# Patient Record
Sex: Female | Born: 2000 | Race: Black or African American | Hispanic: No | Marital: Single | Smoking: Never smoker
Health system: Southern US, Community
[De-identification: ages and names within clinical notes are randomized; demographics above are authoritative.]

## PROBLEM LIST (undated history)

## (undated) DIAGNOSIS — J45909 Unspecified asthma, uncomplicated: Secondary | ICD-10-CM

## (undated) HISTORY — DX: Unspecified asthma, uncomplicated: J45.909

## (undated) HISTORY — PX: NO PAST SURGERIES: SHX2092

---
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
## 2019-11-14 ENCOUNTER — Emergency Department (HOSPITAL_COMMUNITY)
Admission: EM | Admit: 2019-11-14 | Discharge: 2019-11-14 | Disposition: A | Discharge status: Home / Self Care | Payer: Medicaid Other

## 2020-11-15 ENCOUNTER — Emergency Department (HOSPITAL_COMMUNITY)
Admission: EM | Admit: 2020-11-15 | Discharge: 2020-11-16 | Disposition: A | Discharge status: Home / Self Care | Payer: Medicaid Other | Attending: Emergency Medicine | Admitting: Emergency Medicine

## 2020-11-15 ENCOUNTER — Emergency Department (HOSPITAL_COMMUNITY): Payer: Medicaid Other

## 2020-11-15 ENCOUNTER — Encounter (HOSPITAL_COMMUNITY): Payer: Self-pay

## 2020-11-15 DIAGNOSIS — M549 Dorsalgia, unspecified: Secondary | ICD-10-CM | POA: Insufficient documentation

## 2020-11-15 DIAGNOSIS — R109 Unspecified abdominal pain: Secondary | ICD-10-CM | POA: Insufficient documentation

## 2020-11-15 DIAGNOSIS — Z5321 Procedure and treatment not carried out due to patient leaving prior to being seen by health care provider: Secondary | ICD-10-CM | POA: Diagnosis not present

## 2020-11-15 DIAGNOSIS — M545 Low back pain, unspecified: Secondary | ICD-10-CM

## 2020-11-15 LAB — COMPREHENSIVE METABOLIC PANEL
ALT: 12 U/L (ref 0–44)
AST: 12 U/L — ABNORMAL LOW (ref 15–41)
Albumin: 4.3 g/dL (ref 3.5–5.0)
Alkaline Phosphatase: 64 U/L (ref 38–126)
Anion gap: 5 (ref 5–15)
BUN: 6 mg/dL (ref 6–20)
CO2: 24 mmol/L (ref 22–32)
Calcium: 9.3 mg/dL (ref 8.9–10.3)
Chloride: 108 mmol/L (ref 98–111)
Creatinine, Ser: 0.64 mg/dL (ref 0.44–1.00)
GFR, Estimated: >60 mL/min (ref >60)
Glucose, Bld: 91 mg/dL (ref 70–99)
Potassium: 4.2 mmol/L (ref 3.5–5.1)
Sodium: 137 mmol/L (ref 135–145)
Total Bilirubin: 1.4 mg/dL — ABNORMAL HIGH (ref 0.3–1.2)
Total Protein: 8 g/dL (ref 6.5–8.1)

## 2020-11-15 LAB — CBC WITH DIFFERENTIAL/PLATELET
Abs Immature Granulocytes: 0.01 10*3/uL (ref 0.00–0.07)
Basophils Absolute: 0 10*3/uL (ref 0.0–0.1)
Basophils Relative: 1 %
Eosinophils Absolute: 0 10*3/uL (ref 0.0–0.5)
Eosinophils Relative: 1 %
HCT: 42.4 % (ref 36.0–46.0)
Hemoglobin: 13.8 g/dL (ref 12.0–15.0)
Immature Granulocytes: 0 %
Lymphocytes Relative: 36 %
Lymphs Abs: 1.6 10*3/uL (ref 0.7–4.0)
MCH: 27.2 pg (ref 26.0–34.0)
MCHC: 32.5 g/dL (ref 30.0–36.0)
MCV: 83.6 fL (ref 80.0–100.0)
Monocytes Absolute: 0.3 10*3/uL (ref 0.1–1.0)
Monocytes Relative: 6 %
Neutro Abs: 2.4 10*3/uL (ref 1.7–7.7)
Neutrophils Relative %: 56 %
Platelets: 259 10*3/uL (ref 150–400)
RBC: 5.07 MIL/uL (ref 3.87–5.11)
RDW: 14.3 % (ref 11.5–15.5)
WBC: 4.3 10*3/uL (ref 4.0–10.5)
nRBC: 0 % (ref 0.0–0.2)

## 2020-11-15 LAB — HCG, SERUM, QUALITATIVE: Preg, Serum: NEGATIVE

## 2020-11-15 NOTE — ED Triage Notes (Signed)
Pt arrived via POV, c/o back pain x3 days. Had this issue x2 weeks, was not seen, spontaneous relief. Denies any trauma. Ambulatory to triage.

## 2020-11-15 NOTE — ED Provider Notes (Signed)
Emergency Medicine Provider Triage Evaluation Note  Theresa Barrett , a 20 y.o. female  was evaluated in triage.  Pt complains of back pain.  Similar situation several days ago with associated diarrhea.  Diarrhea and back pain resolved. But now back pain returned.  Associated nausea, no vomiting.  No urinary symptoms.  No trauma or injury.  Review of Systems  Positive: Back pain Negative: fever  Physical Exam  BP 122/72 (BP Location: Right Arm)   Pulse 77   Temp 98.8 F (37.1 C) (Oral)   Resp 18   Ht 5\' 4"  (1.626 m)   SpO2 98%  Gen:   Awake, no distress   Resp:  Normal effort  MSK:   Moves extremities without difficulty  Other:  Ttp of bilateral flanks L>R  Medical Decision Making  Medically screening exam initiated at 6:28 PM.  Appropriate orders placed.  [name] was informed that the remainder of the evaluation will be completed by another provider, this initial triage assessment does not replace that evaluation, and the importance of remaining in the ED until their evaluation is complete.     Wilber Bihari, PA-C 11/15/20 1829    01/15/21, MD 11/15/20 1857

## 2021-01-25 ENCOUNTER — Emergency Department (HOSPITAL_COMMUNITY)
Admission: EM | Admit: 2021-01-25 | Discharge: 2021-01-25 | Disposition: A | Discharge status: Home / Self Care | Payer: Medicaid Other | Attending: Emergency Medicine | Admitting: Emergency Medicine

## 2021-01-25 ENCOUNTER — Encounter (HOSPITAL_COMMUNITY): Payer: Self-pay | Admitting: Emergency Medicine

## 2021-01-25 ENCOUNTER — Other Ambulatory Visit: Payer: Self-pay

## 2021-01-25 DIAGNOSIS — N9489 Other specified conditions associated with female genital organs and menstrual cycle: Secondary | ICD-10-CM | POA: Insufficient documentation

## 2021-01-25 DIAGNOSIS — Z3202 Encounter for pregnancy test, result negative: Secondary | ICD-10-CM | POA: Diagnosis not present

## 2021-01-25 DIAGNOSIS — R110 Nausea: Secondary | ICD-10-CM | POA: Diagnosis not present

## 2021-01-25 LAB — I-STAT BETA HCG BLOOD, ED (MC, WL, AP ONLY): I-stat hCG, quantitative: <5 m[IU]/mL (ref <5)

## 2021-01-25 NOTE — ED Triage Notes (Signed)
Patient presents after having 2 positive urine pregnancy tests x3 days ago and would like confirmation. Patient complains of being tired with some associated nausea.

## 2021-01-25 NOTE — ED Provider Notes (Signed)
[facility]   CSN: [id number] Arrival date & time: 01/25/21  0120     History Chief Complaint  Patient presents with   Requesting Pregnancy Test    Theresa Barrett is a 20 y.o. female.  Pt presents to the ED today requesting a pregnancy test.  She had a very light period in December.  Pt has some nausea.        No past medical history on file.  Patient Active Problem List   Diagnosis Date Noted   Obesity, unspecified 04/16/2013    History reviewed. No pertinent surgical history.   OB History   No obstetric history on file.     No family history on file.  Social History   Tobacco Use   Smoking status: Never   Smokeless tobacco: Never  Substance Use Topics   Alcohol use: Yes   Drug use: Yes    Home Medications Prior to Admission medications   Medication Sig Start Date End Date Taking? Authorizing Provider  albuterol (PROVENTIL HFA;VENTOLIN HFA) 108 (90 BASE) MCG/ACT inhaler Inhale 2 puffs into the lungs every 6 (six) hours as needed for wheezing or shortness of breath. Use before exercise as needed 04/16/13   Copland, Gwenlyn Found, MD    Allergies    Patient has no known allergies.  Review of Systems   Review of Systems  Gastrointestinal:  Positive for nausea.  All other systems reviewed and are negative.  Physical Exam Updated Vital Signs BP (!) 143/100 (BP Location: Right Arm)   Pulse 87   Temp 97.8 F (36.6 C) (Oral)   Resp 17   Ht 5\' 5"  (1.651 m)   Wt 78.5 kg   SpO2 100%   BMI 28.79 kg/m   Physical Exam Vitals and nursing note reviewed.  Constitutional:      Appearance: Normal appearance.  HENT:     Head: Normocephalic and atraumatic.     Right Ear: External ear normal.     Left Ear: External ear normal.     Nose: Nose normal.     Mouth/Throat:     Mouth: Mucous membranes are moist.     Pharynx: Oropharynx is clear.  Eyes:     Extraocular Movements: Extraocular movements intact.      Conjunctiva/sclera: Conjunctivae normal.     Pupils: Pupils are equal, round, and reactive to light.  Cardiovascular:     Rate and Rhythm: Normal rate and regular rhythm.     Pulses: Normal pulses.     Heart sounds: Normal heart sounds.  Pulmonary:     Effort: Pulmonary effort is normal.     Breath sounds: Normal breath sounds.  Abdominal:     General: Abdomen is flat. Bowel sounds are normal.     Palpations: Abdomen is soft.  Musculoskeletal:        General: Normal range of motion.     Cervical back: Normal range of motion and neck supple.  Skin:    General: Skin is warm.     Capillary Refill: Capillary refill takes less than 2 seconds.  Neurological:     General: No focal deficit present.     Mental Status: She is alert and oriented to person, place, and time.  Psychiatric:        Mood and Affect: Mood normal.        Behavior: Behavior normal.    ED Results / Procedures / Treatments   Labs (all labs ordered are listed, but  only abnormal results are displayed) Labs Reviewed  I-STAT BETA HCG BLOOD, ED ([facility], [facility], [facility] ONLY)    EKG None  Radiology No results found.  Procedures Procedures   Medications Ordered in ED Medications - No data to display  ED Course  I have reviewed the triage vital signs and the nursing notes.  Pertinent labs & imaging results that were available during my care of the patient were reviewed by me and considered in my medical decision making (see chart for details).    MDM Rules/Calculators/A&P                           Pregnancy test is negative.  She does not want any other evaluation.  She is stable for d/c.  She is [facility] f[facility]u with the [facility] clinic.  Return if worse.  Final Clinical Impression(s) / ED Diagnoses Final diagnoses:  Pregnancy examination or test, negative result    Rx / DC Orders ED Discharge Orders     None        Jacalyn Lefevre, MD 01/25/21 [id number]

## 2021-01-25 NOTE — Discharge Instructions (Signed)
If abd pain worsens, you need to come back.

## 2021-08-25 ENCOUNTER — Encounter: Payer: Medicaid Other | Admitting: Obstetrics

## 2021-09-10 ENCOUNTER — Encounter: Payer: Self-pay | Admitting: Obstetrics and Gynecology

## 2021-09-10 ENCOUNTER — Other Ambulatory Visit (HOSPITAL_COMMUNITY)
Admission: RE | Admit: 2021-09-10 | Discharge: 2021-09-10 | Disposition: A | Discharge status: Home / Self Care | Payer: Medicaid Other | Source: Ambulatory Visit | Attending: Obstetrics and Gynecology | Admitting: Obstetrics and Gynecology

## 2021-09-10 ENCOUNTER — Ambulatory Visit (INDEPENDENT_AMBULATORY_CARE_PROVIDER_SITE_OTHER): Payer: Medicaid Other | Admitting: Obstetrics and Gynecology

## 2021-09-10 DIAGNOSIS — N760 Acute vaginitis: Secondary | ICD-10-CM | POA: Diagnosis not present

## 2021-09-10 DIAGNOSIS — A5602 Chlamydial vulvovaginitis: Secondary | ICD-10-CM | POA: Diagnosis not present

## 2021-09-10 DIAGNOSIS — L732 Hidradenitis suppurativa: Secondary | ICD-10-CM

## 2021-09-10 DIAGNOSIS — Z202 Contact with and (suspected) exposure to infections with a predominantly sexual mode of transmission: Secondary | ICD-10-CM | POA: Diagnosis present

## 2021-09-10 DIAGNOSIS — Z01419 Encounter for gynecological examination (general) (routine) without abnormal findings: Secondary | ICD-10-CM | POA: Diagnosis present

## 2021-09-10 MED ORDER — DOXYCYCLINE HYCLATE 100 MG PO CAPS: 100.0000 mg | ORAL_CAPSULE | Freq: Two times a day (BID) | ORAL | 0 refills | Status: DC

## 2021-09-10 NOTE — Progress Notes (Signed)
Theresa Barrett is a 21 y.o. G0P0000 female here for a routine annual gynecologic exam.  Current complaints: "skin problem".   Denies abnormal vaginal bleeding, discharge, pelvic pain, problems with intercourse or other gynecologic concerns.    Gynecologic History Patient's last menstrual period was 08/21/2021. Contraception: condoms Last Pap: NA.  Last mammogram: NA.   Obstetric History OB History  Gravida Para Term Preterm AB Living  0 0 0 0 0 0  SAB IAB Ectopic Multiple Live Births  0 0 0 0 0    Past Medical History:  Diagnosis Date   Asthma     Past Surgical History:  Procedure Laterality Date   NO PAST SURGERIES      Current Outpatient Medications on File Prior to Visit  Medication Sig Dispense Refill   albuterol (PROVENTIL HFA;VENTOLIN HFA) 108 (90 BASE) MCG/ACT inhaler Inhale 2 puffs into the lungs every 6 (six) hours as needed for wheezing or shortness of breath. Use before exercise as needed (Patient not taking: Reported on 09/10/2021) 1 Inhaler 2   No current facility-administered medications on file prior to visit.    No Known Allergies  Social History   Socioeconomic History   Marital status: Single    Spouse name: Not on file   Number of children: 0   Years of education: Not on file   Highest education level: Not on file  Occupational History   Not on file  Tobacco Use   Smoking status: Never   Smokeless tobacco: Never  Vaping Use   Vaping Use: Never used  Substance and Sexual Activity   Alcohol use: Yes   Drug use: Yes    Types: Marijuana   Sexual activity: Yes    Birth control/protection: None  Other Topics Concern   Not on file  Social History Narrative   Not on file   Social Determinants of Health   Financial Resource Strain: Not on file  Food Insecurity: Not on file  Transportation Needs: Not on file  Physical Activity: Not on file  Stress: Not on file  Social Connections: Not on file  Intimate Partner Violence: Not on file     Family History  Problem Relation Age of Onset   Cancer Paternal Grandmother     The following portions of the patient's history were reviewed and updated as appropriate: allergies, current medications, past family history, past medical history, past social history, past surgical history and problem list.  Review of Systems Pertinent items are noted in HPI.   Objective:  BP 121/76   Pulse 71   Ht 5\' 5"  (1.651 m)   Wt 159 lb (72.1 kg)   LMP 08/21/2021   BMI 26.46 kg/m  CONSTITUTIONAL: Well-developed, well-nourished female in no acute distress.  HENT:  Normocephalic, atraumatic, External right and left ear normal. Oropharynx is clear and moist EYES: Conjunctivae and EOM are normal. Pupils are equal, round, and reactive to light. No scleral icterus.  NECK: Normal range of motion, supple, no masses.  Normal thyroid.  SKIN: Skin is warm and dry. No rash noted. Not diaphoretic. No erythema. No pallor. NEUROLGIC: Alert and oriented to person, place, and time. Normal reflexes, muscle tone coordination. No cranial nerve deficit noted. PSYCHIATRIC: Normal mood and affect. Normal behavior. Normal judgment and thought content. CARDIOVASCULAR: Normal heart rate noted, regular rhythm RESPIRATORY: Clear to auscultation bilaterally. Effort and breath sounds normal, no problems with respiration noted. BREASTS: Symmetric in size. No masses, skin changes, nipple drainage, or lymphadenopathy. Areas of hidradenitis noted  ABDOMEN: Soft, normal bowel sounds, no distention noted.  No tenderness, rebound or guarding.  PELVIC: Normal appearing external genitalia; normal appearing vaginal mucosa and cervix.  No abnormal discharge noted.  Pap smear obtained.  Normal uterine size, no other palpable masses, no uterine or adnexal tenderness. MUSCULOSKELETAL: Normal range of motion. No tenderness.  No cyanosis, clubbing, or edema.  2+ distal pulses.   Assessment:  Annual gynecologic examination with pap  smear STD test Hidradenitis Plan:  Will follow up results of pap smear and manage accordingly. Declines contraception. STD testing as per pt request Doxycyline for hidradenitis Routine preventative health maintenance measures emphasized. Please refer to After Visit Summary for other counseling recommendations.    Hermina Staggers, MD, FACOG Attending Obstetrician & Gynecologist Center for [facility], [facility]

## 2021-09-10 NOTE — Patient Instructions (Signed)

## 2021-09-10 NOTE — Progress Notes (Signed)
21 y.o New GYN presents for AEX/PAP/STD screening. Reports no problems today.

## 2021-09-11 LAB — CERVICOVAGINAL ANCILLARY ONLY
Bacterial Vaginitis (gardnerella): POSITIVE — AB
Candida Glabrata: NEGATIVE
Candida Vaginitis: NEGATIVE
Chlamydia: POSITIVE — AB
Comment: NEGATIVE
Comment: NEGATIVE
Comment: NEGATIVE
Comment: NEGATIVE
Comment: NEGATIVE
Comment: NORMAL
Neisseria Gonorrhea: NEGATIVE
Trichomonas: NEGATIVE

## 2021-09-11 LAB — HEPB+HEPC+HIV PANEL
HIV Screen 4th Generation wRfx: NONREACTIVE
Hep B C IgM: NEGATIVE
Hep B Core Total Ab: NEGATIVE
Hep B E Ab: NEGATIVE
Hep B E Ag: NEGATIVE
Hep B Surface Ab, Qual: NONREACTIVE
Hep C Virus Ab: NONREACTIVE
Hepatitis B Surface Ag: NEGATIVE

## 2021-09-11 LAB — RPR: RPR Ser Ql: NONREACTIVE

## 2021-09-14 ENCOUNTER — Telehealth: Payer: Self-pay

## 2021-09-14 ENCOUNTER — Other Ambulatory Visit: Payer: Self-pay

## 2021-09-14 DIAGNOSIS — B9689 Other specified bacterial agents as the cause of diseases classified elsewhere: Secondary | ICD-10-CM

## 2021-09-14 MED ORDER — METRONIDAZOLE 500 MG PO TABS: 500.0000 mg | ORAL_TABLET | Freq: Two times a day (BID) | ORAL | 0 refills | Status: AC

## 2021-09-14 NOTE — Telephone Encounter (Signed)
-----   Message from Hermina Staggers, MD sent at 09/12/2021  4:52 PM EDT ----- Please send in Rx for chlamydia and BV as per protocol. Please let pt know and advise to have partner evaluated. TOC in 3-4 weeks  Thanks Casimiro Needle

## 2021-09-14 NOTE — Telephone Encounter (Signed)
I connected with  Theresa Barrett on 09/14/21 by telephone and verified that I am speaking with the correct person using two identifiers.  Pt made aware of results and verbalizes understanding. Pt is currently taking Doxycycline. Flagyl will be sent in for BV.   Pt has no further questions at this time.

## 2021-09-16 LAB — CYTOLOGY - PAP

## 2021-10-14 ENCOUNTER — Ambulatory Visit: Payer: Medicaid Other | Admitting: Obstetrics and Gynecology

## 2021-12-09 ENCOUNTER — Ambulatory Visit: Payer: Medicaid Other | Admitting: Obstetrics and Gynecology

## 2022-01-12 ENCOUNTER — Ambulatory Visit: Payer: Medicaid Other | Admitting: Obstetrics and Gynecology

## 2022-01-23 ENCOUNTER — Emergency Department (HOSPITAL_BASED_OUTPATIENT_CLINIC_OR_DEPARTMENT_OTHER)
Admission: EM | Admit: 2022-01-23 | Discharge: 2022-01-23 | Disposition: A | Discharge status: Home / Self Care | Payer: Medicaid Other | Attending: Emergency Medicine | Admitting: Emergency Medicine

## 2022-01-23 ENCOUNTER — Encounter (HOSPITAL_BASED_OUTPATIENT_CLINIC_OR_DEPARTMENT_OTHER): Payer: Self-pay | Admitting: Emergency Medicine

## 2022-01-23 DIAGNOSIS — K625 Hemorrhage of anus and rectum: Secondary | ICD-10-CM | POA: Diagnosis present

## 2022-01-23 DIAGNOSIS — K6289 Other specified diseases of anus and rectum: Secondary | ICD-10-CM | POA: Diagnosis not present

## 2022-01-23 DIAGNOSIS — J45909 Unspecified asthma, uncomplicated: Secondary | ICD-10-CM | POA: Diagnosis not present

## 2022-01-23 MED ORDER — PROCTOFOAM HC 1-1 % EX FOAM: 1.0000 | Freq: Three times a day (TID) | CUTANEOUS | 1 refills | Status: AC

## 2022-01-23 NOTE — ED Provider Notes (Signed)
[facility] Provider Note: [name], MD, FACEP  CSN: [id number] MRN: [id number] ARRIVAL: 01/23/22 at 0252 ROOM: [facility]06/[facility]   CHIEF COMPLAINT  Rectal Bleeding   HISTORY OF PRESENT ILLNESS  01/23/22 3:15 AM Theresa Barrett is a 21 y.o. female who, earlier this morning, felt the urge to defecate.  When she strained to move her bowels she was unable to pass stool but did pass blood which she noticed on the toilet tissue.  She did also pass urine.  She does not believe the blood came to the vagina.  The blood was not profuse and does not continue passing.  She denies associated diarrhea, fever or abdominal pain.  She has never engaged in anal intercourse.    Past Medical History:  Diagnosis Date   Asthma     Past Surgical History:  Procedure Laterality Date   NO PAST SURGERIES      Family History  Problem Relation Age of Onset   Cancer Paternal Grandmother     Social History   Tobacco Use   Smoking status: Never   Smokeless tobacco: Never  Vaping Use   Vaping Use: Never used  Substance Use Topics   Alcohol use: Yes   Drug use: Yes    Types: Marijuana    Prior to Admission medications   Medication Sig Start Date End Date Taking? Authorizing Provider  hydrocortisone-pramoxine (PROCTOFOAM HC) rectal foam Place 1 applicator rectally 3 (three) times daily. 01/23/22  Yes Diogo Anne, MD    Allergies Patient has no known allergies.   REVIEW OF SYSTEMS  Negative except as noted here or in the History of Present Illness.   PHYSICAL EXAMINATION  Initial Vital Signs Blood pressure (!) 140/81, pulse (!) 109, temperature 98.3 F (36.8 C), temperature source Oral, resp. rate 20, height 5\' 5"  (1.651 m), weight 72.1 kg, last menstrual period 12/28/2021, SpO2 100 %.  Examination General: Well-developed, well-nourished female in no acute distress; appearance consistent with age of record HENT: normocephalic; atraumatic Eyes: Normal appearance Neck:  supple Heart: regular rate and rhythm Lungs: clear to auscultation bilaterally Abdomen: soft; nondistended; nontender; bowel sounds present GU: Normal external genitalia; no vaginal bleeding; physiologic appearing vaginal discharge Rectal: See anoscopy Extremities: No deformity; full range of motion; pulses normal Neurologic: Awake, alert and oriented; motor function intact in all extremities and symmetric; no facial droop Skin: Warm and dry Psychiatric: Normal mood and affect   RESULTS  Summary of this visit's results, reviewed and interpreted by myself:   EKG Interpretation  Date/Time:    Ventricular Rate:    PR Interval:    QRS Duration:   QT Interval:    QTC Calculation:   R Axis:     Text Interpretation:         Laboratory Studies: No results found for this or any previous visit (from the past 24 hour(s)). Imaging Studies: No results found.  ED COURSE and MDM  Nursing notes, initial and subsequent vitals signs, including pulse oximetry, reviewed and interpreted by myself.  Vitals:   01/23/22 0302 01/23/22 0304  BP:  (!) 140/81  Pulse:  (!) 109  Resp:  20  Temp:  98.3 F (36.8 C)  TempSrc:  Oral  SpO2:  100%  Weight: 72.1 kg   Height: 5\' 5"  (1.651 m)    Medications - No data to display  The patient's symptoms and exam are consistent with proctitis.  The degree of proctitis appears mild on examination.  There is only punctate  bleeding.  I doubt an STD as the etiology as she has never engaged in anal intercourse.  She has no history of inflammatory bowel disease and is having no diarrhea or abdominal pain to suggest more proximal involvement.  Inflammatory bowel disease cannot be completely ruled out however.  We will treat with Proctofoam in the short-term and refer to gastroenterology for more definitive workup.  PROCEDURES  Anoscopy  Date/Time: 01/23/2022 3:26 AM  Performed by: Dushawn Pusey, MD Authorized by: Lenoria Narine, MD   Consent:    Consent  obtained:  Verbal   Consent given by:  Patient   Risks, benefits, and alternatives were discussed: yes     Risks discussed:  Pain Universal protocol:    Procedure explained and questions answered to patient or proxy's satisfaction: yes     Required blood products, implants, devices, and special equipment available: yes     Immediately prior to procedure, a time out was called: yes     Patient identity confirmed:  Verbally with patient Indications:    Indications: rectal bleeding   Procedure details:    Internal hemorrhoids: no     Inflammation: yes     Anal fissures: no     Anal fistulae: no     Anal stricture: no     Abscess: no     Tearing: no     Blood in rectal vault: yes (specks)     Bleeding site:  Distal rectum Post-procedure details:    Procedure completion:  Tolerated well, no immediate complications   ED DIAGNOSES     ICD-10-CM   1. Proctitis  K62.89          Kerrie Latour, Jonny Ruiz, MD 01/23/22 [phone]

## 2022-01-23 NOTE — ED Triage Notes (Signed)
Pt states had the urge to have a stool and had to strain. Was only able to urinate. When pt wiped had large amount of blood. Denies any bleeding now,   Also states back pain. Has a job with heavy lifting/moving.

## 2022-01-23 NOTE — ED Notes (Signed)
Pt discharged to home. Discharge instructions have been discussed with patient and/or family members. Pt verbally acknowledges understanding d/c instructions, and endorses comprehension to checkout at registration before leaving.  °

## 2022-02-18 ENCOUNTER — Ambulatory Visit: Payer: Medicaid Other | Admitting: Obstetrics and Gynecology

## 2022-02-21 ENCOUNTER — Emergency Department (HOSPITAL_BASED_OUTPATIENT_CLINIC_OR_DEPARTMENT_OTHER)
Admission: EM | Admit: 2022-02-21 | Discharge: 2022-02-22 | Disposition: A | Discharge status: Home / Self Care | Payer: Medicaid Other | Attending: Emergency Medicine | Admitting: Emergency Medicine

## 2022-02-21 ENCOUNTER — Encounter (HOSPITAL_BASED_OUTPATIENT_CLINIC_OR_DEPARTMENT_OTHER): Payer: Self-pay

## 2022-02-21 ENCOUNTER — Other Ambulatory Visit: Payer: Self-pay

## 2022-02-21 DIAGNOSIS — J029 Acute pharyngitis, unspecified: Secondary | ICD-10-CM | POA: Insufficient documentation

## 2022-02-21 DIAGNOSIS — Z1152 Encounter for screening for COVID-19: Secondary | ICD-10-CM | POA: Diagnosis not present

## 2022-02-21 DIAGNOSIS — J039 Acute tonsillitis, unspecified: Secondary | ICD-10-CM

## 2022-02-21 LAB — GROUP A STREP BY PCR: Group A Strep by PCR: NOT DETECTED

## 2022-02-21 MED ORDER — ACETAMINOPHEN 325 MG PO TABS
650.0000 mg | ORAL_TABLET | Freq: Once | ORAL | Status: AC
Start: 2022-02-21 — End: 2022-02-21
  Administered 2022-02-21: 650 mg via ORAL
  Filled 2022-02-21: qty 2

## 2022-02-21 NOTE — ED Provider Notes (Signed)
[facility] EMERGENCY DEPARTMENT Provider Note   CSN: [id number] Arrival date & time: 02/21/22  2203     History  Chief Complaint  Patient presents with   Sore Throat    Theresa Barrett is a 22 y.o. female.  Patient is a 22 year old female with no significant past medical history.  Patient presenting with complaints of sore throat.  She describes swelling and pain to her tonsils and spitting up mucus.  This is occasionally blood-tinged.  She denies any difficulty breathing.  Pain is worse with swallowing with no alleviating factors.  She denies any ill contacts.  The history is provided by the patient.       Home Medications Prior to Admission medications   Medication Sig Start Date End Date Taking? Authorizing Provider  hydrocortisone-pramoxine (PROCTOFOAM HC) rectal foam Place 1 applicator rectally 3 (three) times daily. 01/23/22   Molpus, Jonny Ruiz, MD      Allergies    Patient has no known allergies.    Review of Systems   Review of Systems  All other systems reviewed and are negative.   Physical Exam Updated Vital Signs BP 113/76 (BP Location: Left Arm)   Pulse (!) 113   Temp 100.3 F (37.9 C) (Oral)   Resp (!) 25   Ht 5\' 5"  (1.651 m)   Wt 70.8 kg   LMP 01/29/2022 (Approximate)   SpO2 98%   BMI 25.96 kg/m  Physical Exam Vitals and nursing note reviewed.  Constitutional:      General: She is not in acute distress.    Appearance: She is well-developed. She is not diaphoretic.  HENT:     Head: Normocephalic and atraumatic.     Mouth/Throat:     Mouth: Mucous membranes are moist.     Pharynx: No pharyngeal swelling or oropharyngeal exudate.     Tonsils: Tonsillar exudate present. 2+ on the right. 2+ on the left.  Neck:     Thyroid: No thyromegaly.  Cardiovascular:     Rate and Rhythm: Normal rate and regular rhythm.     Heart sounds: No murmur heard.    No friction rub. No gallop.  Pulmonary:     Effort: Pulmonary effort is normal. No respiratory  distress.     Breath sounds: Normal breath sounds. No wheezing.  Abdominal:     General: Bowel sounds are normal. There is no distension.     Palpations: Abdomen is soft.     Tenderness: There is no abdominal tenderness.  Musculoskeletal:        General: Normal range of motion.     Cervical back: Normal range of motion and neck supple.  Lymphadenopathy:     Cervical: No cervical adenopathy.  Skin:    General: Skin is warm and dry.  Neurological:     General: No focal deficit present.     Mental Status: She is alert and oriented to person, place, and time.     ED Results / Procedures / Treatments   Labs (all labs ordered are listed, but only abnormal results are displayed) Labs Reviewed  GROUP A STREP BY PCR    EKG None  Radiology No results found.  Procedures Procedures  {Document cardiac monitor, telemetry assessment procedure when appropriate:1}  Medications Ordered in ED Medications  acetaminophen (TYLENOL) tablet 650 mg (650 mg Oral Given 02/21/22 2249)    ED Course/ Medical Decision Making/ A&P  Medical Decision Making Amount and/or Complexity of Data Reviewed Labs: ordered.  Risk OTC drugs.   ***  {Document critical care time when appropriate:1} {Document review of labs and clinical decision tools ie heart score, Chads2Vasc2 etc:1}  {Document your independent review of radiology images, and any outside records:1} {Document your discussion with family members, caretakers, and with consultants:1} {Document social determinants of health affecting pt's care:1} {Document your decision making why or why not admission, treatments were needed:1} Final Clinical Impression(s) / ED Diagnoses Final diagnoses:  None    Rx / DC Orders ED Discharge Orders     None

## 2022-02-21 NOTE — ED Triage Notes (Signed)
Pt reports sore throat and pain with swallowing that started yesterday. She states she been spitting up blood tinged mucous. She also reports chills, dizziness and shortness of breath. There is an area on the right side of her neck that appears slightly swollen. Throat appears reddened with white patches. Pt is speaking clear complete sentences.

## 2022-02-22 LAB — RESP PANEL BY RT-PCR (RSV, FLU A&B, COVID)  RVPGX2
Influenza A by PCR: NEGATIVE
Influenza B by PCR: NEGATIVE
Resp Syncytial Virus by PCR: NEGATIVE
SARS Coronavirus 2 by RT PCR: NEGATIVE

## 2022-02-22 LAB — CBC WITH DIFFERENTIAL/PLATELET
Abs Immature Granulocytes: 0.2 10*3/uL — ABNORMAL HIGH (ref 0.00–0.07)
Band Neutrophils: 2 %
Basophils Absolute: 0.4 10*3/uL — ABNORMAL HIGH (ref 0.0–0.1)
Basophils Relative: 2 %
Eosinophils Absolute: 0 10*3/uL (ref 0.0–0.5)
Eosinophils Relative: 0 %
HCT: 40.4 % (ref 36.0–46.0)
Hemoglobin: 13.4 g/dL (ref 12.0–15.0)
Lymphocytes Relative: 7 %
Lymphs Abs: 1.3 10*3/uL (ref 0.7–4.0)
MCH: 26.7 pg (ref 26.0–34.0)
MCHC: 33.2 g/dL (ref 30.0–36.0)
MCV: 80.6 fL (ref 80.0–100.0)
Metamyelocytes Relative: 1 %
Monocytes Absolute: 1.3 10*3/uL — ABNORMAL HIGH (ref 0.1–1.0)
Monocytes Relative: 7 %
Neutro Abs: 15.3 10*3/uL — ABNORMAL HIGH (ref 1.7–7.7)
Neutrophils Relative %: 81 %
Platelets: 212 10*3/uL (ref 150–400)
RBC: 5.01 MIL/uL (ref 3.87–5.11)
RDW: 14.6 % (ref 11.5–15.5)
WBC: 18.4 10*3/uL — ABNORMAL HIGH (ref 4.0–10.5)
nRBC: 0 % (ref 0.0–0.2)

## 2022-02-22 LAB — MONONUCLEOSIS SCREEN: Mono Screen: NEGATIVE

## 2022-02-22 MED ORDER — CEPHALEXIN 500 MG PO CAPS: 500.0000 mg | ORAL_CAPSULE | Freq: Four times a day (QID) | ORAL | 0 refills | Status: AC

## 2022-02-22 MED ORDER — CEPHALEXIN 250 MG PO CAPS
500.0000 mg | ORAL_CAPSULE | Freq: Once | ORAL | Status: AC
  Administered 2022-02-22: 500 mg via ORAL
  Filled 2022-02-22: qty 2

## 2022-02-22 NOTE — Discharge Instructions (Signed)
Begin taking Keflex as prescribed.  Take ibuprofen 600 mg every 6 hours as needed for pain.  Drink plenty of fluids and get plenty of rest.  Return to the ER if symptoms significantly worsen or change.

## 2022-08-10 ENCOUNTER — Emergency Department (HOSPITAL_BASED_OUTPATIENT_CLINIC_OR_DEPARTMENT_OTHER)
Admission: EM | Admit: 2022-08-10 | Discharge: 2022-08-10 | Disposition: A | Discharge status: Home / Self Care | Payer: Medicaid Other | Attending: Emergency Medicine | Admitting: Emergency Medicine

## 2022-08-10 ENCOUNTER — Encounter (HOSPITAL_BASED_OUTPATIENT_CLINIC_OR_DEPARTMENT_OTHER): Payer: Self-pay

## 2022-08-10 ENCOUNTER — Emergency Department (HOSPITAL_BASED_OUTPATIENT_CLINIC_OR_DEPARTMENT_OTHER): Payer: Medicaid Other

## 2022-08-10 ENCOUNTER — Other Ambulatory Visit: Payer: Self-pay

## 2022-08-10 DIAGNOSIS — Z113 Encounter for screening for infections with a predominantly sexual mode of transmission: Secondary | ICD-10-CM | POA: Diagnosis not present

## 2022-08-10 DIAGNOSIS — X58XXXA Exposure to other specified factors, initial encounter: Secondary | ICD-10-CM | POA: Insufficient documentation

## 2022-08-10 DIAGNOSIS — K2900 Acute gastritis without bleeding: Secondary | ICD-10-CM | POA: Diagnosis not present

## 2022-08-10 DIAGNOSIS — R42 Dizziness and giddiness: Secondary | ICD-10-CM | POA: Diagnosis present

## 2022-08-10 DIAGNOSIS — S060X9A Concussion with loss of consciousness of unspecified duration, initial encounter: Secondary | ICD-10-CM | POA: Diagnosis not present

## 2022-08-10 LAB — URINALYSIS, W/ REFLEX TO CULTURE (INFECTION SUSPECTED)
Bacteria, UA: NONE SEEN
Bilirubin Urine: NEGATIVE
Glucose, UA: NEGATIVE mg/dL
Hgb urine dipstick: NEGATIVE
Ketones, ur: NEGATIVE mg/dL
Leukocytes,Ua: NEGATIVE
Nitrite: NEGATIVE
Specific Gravity, Urine: 1.025 (ref 1.005–1.030)
pH: 7.5 (ref 5.0–8.0)

## 2022-08-10 LAB — CBC WITH DIFFERENTIAL/PLATELET
Abs Immature Granulocytes: 0 10*3/uL (ref 0.00–0.07)
Basophils Absolute: 0 10*3/uL (ref 0.0–0.1)
Basophils Relative: 1 %
Eosinophils Absolute: 0 10*3/uL (ref 0.0–0.5)
Eosinophils Relative: 1 %
HCT: 42.4 % (ref 36.0–46.0)
Hemoglobin: 13.8 g/dL (ref 12.0–15.0)
Immature Granulocytes: 0 %
Lymphocytes Relative: 48 %
Lymphs Abs: 1.8 10*3/uL (ref 0.7–4.0)
MCH: 26.9 pg (ref 26.0–34.0)
MCHC: 32.5 g/dL (ref 30.0–36.0)
MCV: 82.7 fL (ref 80.0–100.0)
Monocytes Absolute: 0.3 10*3/uL (ref 0.1–1.0)
Monocytes Relative: 8 %
Neutro Abs: 1.6 10*3/uL — ABNORMAL LOW (ref 1.7–7.7)
Neutrophils Relative %: 42 %
Platelets: 263 10*3/uL (ref 150–400)
RBC: 5.13 MIL/uL — ABNORMAL HIGH (ref 3.87–5.11)
RDW: 14 % (ref 11.5–15.5)
WBC: 3.8 10*3/uL — ABNORMAL LOW (ref 4.0–10.5)
nRBC: 0 % (ref 0.0–0.2)

## 2022-08-10 LAB — BASIC METABOLIC PANEL
Anion gap: 7 (ref 5–15)
BUN: 6 mg/dL (ref 6–20)
CO2: 28 mmol/L (ref 22–32)
Calcium: 9.5 mg/dL (ref 8.9–10.3)
Chloride: 104 mmol/L (ref 98–111)
Creatinine, Ser: 0.61 mg/dL (ref 0.44–1.00)
GFR, Estimated: >60 mL/min (ref >60)
Glucose, Bld: 82 mg/dL (ref 70–99)
Potassium: 4 mmol/L (ref 3.5–5.1)
Sodium: 139 mmol/L (ref 135–145)

## 2022-08-10 LAB — HEPATIC FUNCTION PANEL
ALT: 10 U/L (ref 0–44)
AST: 14 U/L — ABNORMAL LOW (ref 15–41)
Albumin: 4.4 g/dL (ref 3.5–5.0)
Alkaline Phosphatase: 65 U/L (ref 38–126)
Bilirubin, Direct: 0.5 mg/dL — ABNORMAL HIGH (ref 0.0–0.2)
Indirect Bilirubin: 1.7 mg/dL — ABNORMAL HIGH (ref 0.3–0.9)
Total Bilirubin: 2.2 mg/dL — ABNORMAL HIGH (ref 0.3–1.2)
Total Protein: 7.6 g/dL (ref 6.5–8.1)

## 2022-08-10 LAB — WET PREP, GENITAL
Clue Cells Wet Prep HPF POC: NONE SEEN
Sperm: NONE SEEN
Trich, Wet Prep: NONE SEEN
WBC, Wet Prep HPF POC: <10 (ref <10)
Yeast Wet Prep HPF POC: NONE SEEN

## 2022-08-10 LAB — PREGNANCY, URINE: Preg Test, Ur: NEGATIVE

## 2022-08-10 LAB — LIPASE, BLOOD: Lipase: 17 U/L (ref 11–51)

## 2022-08-10 MED ORDER — SODIUM CHLORIDE 0.9 % IV SOLN
INTRAVENOUS | Status: DC
  Administered 2022-08-10: 1000 mL via INTRAVENOUS

## 2022-08-10 MED ORDER — METOCLOPRAMIDE HCL 5 MG/ML IJ SOLN
10.0000 mg | Freq: Once | INTRAMUSCULAR | Status: AC
  Administered 2022-08-10: 10 mg via INTRAVENOUS
  Filled 2022-08-10: qty 2

## 2022-08-10 MED ORDER — IOHEXOL 300 MG/ML  SOLN
100.0000 mL | Freq: Once | INTRAMUSCULAR | Status: AC | PRN
  Administered 2022-08-10: 75 mL via INTRAVENOUS

## 2022-08-10 MED ORDER — FAMOTIDINE 20 MG PO TABS: 20.0000 mg | ORAL_TABLET | Freq: Two times a day (BID) | ORAL | 0 refills | Status: AC

## 2022-08-10 MED ORDER — SODIUM CHLORIDE 0.9 % IV BOLUS
1000.0000 mL | Freq: Once | INTRAVENOUS | Status: AC
  Administered 2022-08-10: 1000 mL via INTRAVENOUS

## 2022-08-10 MED ORDER — DIPHENHYDRAMINE HCL 50 MG/ML IJ SOLN
12.5000 mg | Freq: Once | INTRAMUSCULAR | Status: AC
  Administered 2022-08-10: 12.5 mg via INTRAVENOUS
  Filled 2022-08-10: qty 1

## 2022-08-10 MED ORDER — FAMOTIDINE IN NACL 20-0.9 MG/50ML-% IV SOLN
20.0000 mg | Freq: Once | INTRAVENOUS | Status: AC
  Administered 2022-08-10: 20 mg via INTRAVENOUS
  Filled 2022-08-10: qty 50

## 2022-08-10 NOTE — Discharge Instructions (Addendum)
Your symptoms are consistent with likely gastritis, probably induced by alcohol use and consumption.  Additionally you have symptoms consistent with likely concussion in the setting of your syncopal episode over the weekend in the setting of your alcohol use.  You declined empiric coverage for STIs but have been tested for STIs and the results were available on the patient portal.

## 2022-08-10 NOTE — ED Provider Notes (Signed)
[place] EMERGENCY DEPARTMENT AT [facility] Provider Note   CSN: [id number] Arrival date & time: 08/10/22  1744     History  Chief Complaint  Patient presents with   SEXUALLY TRANSMITTED DISEASE    Theresa Barrett is a 22 y.o. female.  HPI   22 year old female presenting to the emergency department with multiple complaints.  The patient states that she had a syncopal episode while drinking on Saturday and was seen in the emergency department with negative CT imaging.  Since then she has had a headache with associated light sensitivity, sound sensitivity and associated nausea, no vomiting.  No fever or neck stiffness.  Additionally, the patient states that she would like to be fully tested for sexually transmitted infections.  She denies any symptoms at this time other than increased urinary frequency.  No vaginal bleeding or discharge.  She endorses mild suprapubic discomfort.  Additionally endorses some left upper quadrant abdominal discomfort which has been present since her episode on Saturday.  Home Medications Prior to Admission medications   Medication Sig Start Date End Date Taking? Authorizing Provider  famotidine (PEPCID) 20 MG tablet Take 1 tablet (20 mg total) by mouth 2 (two) times daily. 08/10/22  Yes Ernie Avena, MD  cephALEXin (KEFLEX) 500 MG capsule Take 1 capsule (500 mg total) by mouth 4 (four) times daily. 02/22/22   Geoffery Lyons, MD  hydrocortisone-pramoxine (PROCTOFOAM HC) rectal foam Place 1 applicator rectally 3 (three) times daily. 01/23/22   Molpus, Jonny Ruiz, MD      Allergies    Patient has no known allergies.    Review of Systems   Review of Systems  All other systems reviewed and are negative.   Physical Exam Updated Vital Signs BP 101/65   Pulse 66   Temp 98.5 F (36.9 C) (Oral)   Resp 18   Ht 5\' 5"  (1.651 m)   Wt 69.4 kg   SpO2 100%   BMI 25.46 kg/m  Physical Exam Vitals and nursing note reviewed. Exam conducted with a chaperone  present.  Constitutional:      General: She is not in acute distress.    Appearance: She is well-developed.  HENT:     Head: Normocephalic and atraumatic.  Eyes:     Conjunctiva/sclera: Conjunctivae normal.  Cardiovascular:     Rate and Rhythm: Normal rate and regular rhythm.  Pulmonary:     Effort: Pulmonary effort is normal. No respiratory distress.     Breath sounds: Normal breath sounds.  Abdominal:     Palpations: Abdomen is soft.     Tenderness: There is abdominal tenderness in the suprapubic area and left upper quadrant. There is no guarding or rebound.  Genitourinary:    Cervix: Normal.     Adnexa: Right adnexa normal and left adnexa normal.     Comments: No significant cervical discharge, Negative cervical motion tenderness or adnexal tenderness, no bleeding Musculoskeletal:        General: No swelling.     Cervical back: Neck supple.  Skin:    General: Skin is warm and dry.     Capillary Refill: Capillary refill takes less than 2 seconds.  Neurological:     Mental Status: She is alert.  Psychiatric:        Mood and Affect: Mood normal.     ED Results / Procedures / Treatments   Labs (all labs ordered are listed, but only abnormal results are displayed) Labs Reviewed  CBC WITH DIFFERENTIAL/PLATELET - Abnormal; Notable for  the following components:      Result Value   WBC 3.8 (*)    RBC 5.13 (*)    Neutro Abs 1.6 (*)    All other components within normal limits  URINALYSIS, W/ REFLEX TO CULTURE (INFECTION SUSPECTED) - Abnormal; Notable for the following components:   Protein, ur TRACE (*)    All other components within normal limits  HEPATIC FUNCTION PANEL - Abnormal; Notable for the following components:   AST 14 (*)    Total Bilirubin 2.2 (*)    Bilirubin, Direct 0.5 (*)    Indirect Bilirubin 1.7 (*)    All other components within normal limits  WET PREP, GENITAL  PREGNANCY, URINE  BASIC METABOLIC PANEL  LIPASE, BLOOD  HIV ANTIBODY (ROUTINE TESTING W  REFLEX)  RPR  GC/CHLAMYDIA PROBE AMP ([place]) NOT AT [facility]    EKG None  Radiology CT ABDOMEN PELVIS W CONTRAST  Result Date: 08/10/2022 CLINICAL DATA:  Abdominal pain, acute, nonlocalized EXAM: CT ABDOMEN AND PELVIS WITH CONTRAST TECHNIQUE: Multidetector CT imaging of the abdomen and pelvis was performed using the standard protocol following bolus administration of intravenous contrast. RADIATION DOSE REDUCTION: This exam was performed according to the departmental dose-optimization program which includes automated exposure control, adjustment of the mA and/or kV according to patient size and/or use of iterative reconstruction technique. CONTRAST:  75mL OMNIPAQUE IOHEXOL 300 MG/ML  SOLN COMPARISON:  CT renal 11/15/2020 FINDINGS: Lower chest: No acute abnormality. Hepatobiliary: No focal liver abnormality. No gallstones, gallbladder wall thickening, or pericholecystic fluid. No biliary dilatation. Pancreas: No focal lesion. Normal pancreatic contour. No surrounding inflammatory changes. No main pancreatic ductal dilatation. Spleen: Normal in size without focal abnormality. Adrenals/Urinary Tract: No adrenal nodule bilaterally. Bilateral kidneys enhance symmetrically. No hydronephrosis. No hydroureter. The urinary bladder is unremarkable. Stomach/Bowel: Stomach is within normal limits. No evidence of bowel wall thickening or dilatation. Appendix appears normal. Vascular/Lymphatic: No abdominal aorta or iliac aneurysm. No abdominal, pelvic, or inguinal lymphadenopathy. Reproductive: Corpus luteum cyst within the left ovary-no further follow-up indicated. Uterus and bilateral adnexa are unremarkable. Other: Trace simple free fluid within pelvis. No intraperitoneal free gas. No organized fluid collection. Musculoskeletal: No abdominal wall hernia or abnormality. No suspicious lytic or blastic osseous lesions. No acute displaced fracture. IMPRESSION: No acute intra-abdominal or intrapelvic abnormality.  Electronically Signed   By: Tish Frederickson M.D.   On: 08/10/2022 21:27    Procedures Procedures    Medications Ordered in ED Medications  sodium chloride 0.9 % bolus 1,000 mL (0 mLs Intravenous Stopped 08/10/22 2137)    And  0.9 %  sodium chloride infusion (1,000 mLs Intravenous New Bag/Given 08/10/22 2216)  metoCLOPramide (REGLAN) injection 10 mg (10 mg Intravenous Given 08/10/22 2022)  diphenhydrAMINE (BENADRYL) injection 12.5 mg (12.5 mg Intravenous Given 08/10/22 2022)  famotidine (PEPCID) IVPB 20 mg premix (0 mg Intravenous Stopped 08/10/22 2121)  iohexol (OMNIPAQUE) 300 MG/ML solution 100 mL (75 mLs Intravenous Contrast Given 08/10/22 2114)    ED Course/ Medical Decision Making/ A&P                             Medical Decision Making Amount and/or Complexity of Data Reviewed Labs: ordered. Radiology: ordered.  Risk Prescription drug management.    22 year old female presenting to the emergency department with multiple complaints.  The patient states that she had a syncopal episode while drinking on Saturday and was seen in the emergency department with negative CT  imaging.  Since then she has had a headache with associated light sensitivity, sound sensitivity and associated nausea, no vomiting.  No fever or neck stiffness.  Additionally, the patient states that she would like to be fully tested for sexually transmitted infections.  She denies any symptoms at this time other than increased urinary frequency.  No vaginal bleeding or discharge.  She endorses mild suprapubic discomfort.  On arrival, the patient was vitally stable, afebrile, not tachycardic, hemodynamically stable, saturating well on room air.  Physical exam significant for suprapubic tenderness to palpation, no rebound or guarding. Some LUQ TTP as well.  Considered alcoholic gastritis in the setting of the patient's recent alcohol use over the weekend.  Patient also presents with headache symptoms consistent with  concussion.  She had CT imaging of the head on Saturday which was unremarkable.  Laboratory evaluation significant for urinalysis without evidence of UTI, negative for hematuria, CBC with leukopenia 3.8, no anemia, BMP unremarkable, hepatic function panel with mildly elevated T. bili and indirect bili.  Urine pregnancy was negative.  A CT abdomen pelvis revealed: IMPRESSION:  No acute intra-abdominal or intrapelvic abnormality.    The patient declines empiric treatment for STIs, plans to follow-up outpatient regarding the results of her testing.  She felt symptomatically improved following a migraine cocktail and IV Pepcid.  Pelvic exam was unremarkable with no significant adnexal tenderness or cervical motion tenderness.  Her wet prep was negative for yeast trichomonas and clue cells.  Urinalysis was unremarkable.  Patient overall symptomatically improved following the above interventions.  Overall stable for discharge with continued outpatient management.  Advised to follow-up on the results of her STI testing on the patient portal.   Final Clinical Impression(s) / ED Diagnoses Final diagnoses:  Acute gastritis, presence of bleeding unspecified, unspecified gastritis type  Encounter for screening examination for sexually transmitted disease  Concussion with loss of consciousness, initial encounter    Rx / DC Orders ED Discharge Orders          Ordered    famotidine (PEPCID) 20 MG tablet  2 times daily        08/10/22 2218              Ernie Avena, MD 08/10/22 2218

## 2022-08-10 NOTE — ED Triage Notes (Signed)
Patient here POV from Home.  Endorses Syncopal Episode Saturday. Was out drinking and had a Syncopal Episode and fall then. Was seen and treated for Same when this occurred at another hospital. Seeks Evaluation for continued Headache. Also would like to be tested for STI.  NAD Noted during triage. A&Ox4. GCS 15. Ambulatory.

## 2022-08-11 LAB — RPR: RPR Ser Ql: NONREACTIVE

## 2022-08-11 LAB — HIV ANTIBODY (ROUTINE TESTING W REFLEX): HIV Screen 4th Generation wRfx: NONREACTIVE

## 2022-08-12 LAB — GC/CHLAMYDIA PROBE AMP (~~LOC~~) NOT AT ARMC
Chlamydia: NEGATIVE
Comment: NEGATIVE
Comment: NORMAL
Neisseria Gonorrhea: NEGATIVE

## 2023-01-23 ENCOUNTER — Encounter (HOSPITAL_BASED_OUTPATIENT_CLINIC_OR_DEPARTMENT_OTHER): Payer: Self-pay | Admitting: Emergency Medicine

## 2023-01-23 ENCOUNTER — Other Ambulatory Visit: Payer: Self-pay

## 2023-01-23 ENCOUNTER — Emergency Department (HOSPITAL_BASED_OUTPATIENT_CLINIC_OR_DEPARTMENT_OTHER)
Admission: EM | Admit: 2023-01-23 | Discharge: 2023-01-23 | Disposition: A | Discharge status: Home / Self Care | Payer: Medicaid Other | Attending: Emergency Medicine | Admitting: Emergency Medicine

## 2023-01-23 ENCOUNTER — Emergency Department (HOSPITAL_BASED_OUTPATIENT_CLINIC_OR_DEPARTMENT_OTHER): Payer: Medicaid Other | Admitting: Radiology

## 2023-01-23 DIAGNOSIS — Z7951 Long term (current) use of inhaled steroids: Secondary | ICD-10-CM | POA: Diagnosis not present

## 2023-01-23 DIAGNOSIS — M791 Myalgia, unspecified site: Secondary | ICD-10-CM | POA: Diagnosis not present

## 2023-01-23 DIAGNOSIS — J069 Acute upper respiratory infection, unspecified: Secondary | ICD-10-CM | POA: Insufficient documentation

## 2023-01-23 DIAGNOSIS — J4521 Mild intermittent asthma with (acute) exacerbation: Secondary | ICD-10-CM | POA: Insufficient documentation

## 2023-01-23 DIAGNOSIS — Z1152 Encounter for screening for COVID-19: Secondary | ICD-10-CM | POA: Diagnosis not present

## 2023-01-23 DIAGNOSIS — R0602 Shortness of breath: Secondary | ICD-10-CM | POA: Diagnosis present

## 2023-01-23 LAB — RESP PANEL BY RT-PCR (RSV, FLU A&B, COVID)  RVPGX2
Influenza A by PCR: NEGATIVE
Influenza B by PCR: NEGATIVE
Resp Syncytial Virus by PCR: NEGATIVE
SARS Coronavirus 2 by RT PCR: NEGATIVE

## 2023-01-23 MED ORDER — IPRATROPIUM-ALBUTEROL 0.5-2.5 (3) MG/3ML IN SOLN
3.0000 mL | Freq: Once | RESPIRATORY_TRACT | Status: AC
  Administered 2023-01-23: 3 mL via RESPIRATORY_TRACT
  Filled 2023-01-23: qty 3

## 2023-01-23 MED ORDER — AEROCHAMBER PLUS FLO-VU MEDIUM MISC
1.0000 | Freq: Once | Status: AC
  Administered 2023-01-23: 1
  Filled 2023-01-23: qty 1

## 2023-01-23 MED ORDER — ALBUTEROL SULFATE HFA 108 (90 BASE) MCG/ACT IN AERS: 1.0000 | INHALATION_SPRAY | Freq: Four times a day (QID) | RESPIRATORY_TRACT | 1 refills | Status: AC | PRN

## 2023-01-23 MED ORDER — OXYMETAZOLINE HCL 0.05 % NA SOLN: 1.0000 | Freq: Two times a day (BID) | NASAL | 0 refills | Status: AC

## 2023-01-23 MED ORDER — GUAIFENESIN-DM 100-10 MG/5ML PO SYRP: 5.0000 mL | ORAL_SOLUTION | ORAL | 0 refills | Status: AC | PRN

## 2023-01-23 MED ORDER — ALBUTEROL SULFATE HFA 108 (90 BASE) MCG/ACT IN AERS
2.0000 | INHALATION_SPRAY | RESPIRATORY_TRACT | Status: DC | PRN
  Administered 2023-01-23: 2 via RESPIRATORY_TRACT
  Filled 2023-01-23: qty 6.7

## 2023-01-23 NOTE — Discharge Instructions (Addendum)
It was a pleasure caring for you today in the emergency department.  Drink plenty of fluids over the next few days, get plenty of rest.  Consider using humidifier in your bedroom. Avoid triggers of your asthma such as strong scents/odors, smoke, etcFollow-up with your PCP for recheck.  Please return to the emergency department for any worsening or worrisome symptoms.

## 2023-01-23 NOTE — ED Provider Notes (Signed)
[place] EMERGENCY DEPARTMENT AT [facility] Provider Note  CSN: [id number] Arrival date & time: 01/23/23 1416  Chief Complaint(s) Shortness of Breath  HPI Theresa Barrett is a 22 y.o. female with past medical history as below, significant for asthma, alcohol abuse, who presents to the ED with complaint of short of breath, out of inhaler  Patient reports she has been feeling unwell since yesterday, positive contacts with upper respiratory infection type symptoms.  She is having some bodyaches, chills, intermittently coughing, no vomiting or nausea.  Having difficulty taking a deep breath.  Run out of albuterol inhaler, does not take daily controller medication.  No travel, no leg swelling, no history of DVT PE  Past Medical History Past Medical History:  Diagnosis Date   Asthma    Patient Active Problem List   Diagnosis Date Noted   Visit for routine gyn exam 09/10/2021   Possible exposure to STD 09/10/2021   Hidradenitis 09/10/2021   Obesity, unspecified 04/16/2013   Home Medication(s) Prior to Admission medications   Medication Sig Start Date End Date Taking? Authorizing Provider  albuterol (VENTOLIN HFA) 108 (90 Base) MCG/ACT inhaler Inhale 1-2 puffs into the lungs every 6 (six) hours as needed for wheezing or shortness of breath. 01/23/23  Yes Tanda Rockers A, DO  guaiFENesin-dextromethorphan (ROBITUSSIN DM) 100-10 MG/5ML syrup Take 5 mLs by mouth every 4 (four) hours as needed for cough. 01/23/23  Yes Tanda Rockers A, DO  oxymetazoline (AFRIN NASAL SPRAY) 0.05 % nasal spray Place 1 spray into both nostrils 2 (two) times daily for 3 days. 01/23/23 01/26/23 Yes Tanda Rockers A, DO  cephALEXin (KEFLEX) 500 MG capsule Take 1 capsule (500 mg total) by mouth 4 (four) times daily. 02/22/22   Geoffery Lyons, MD  famotidine (PEPCID) 20 MG tablet Take 1 tablet (20 mg total) by mouth 2 (two) times daily. 08/10/22   Ernie Avena, MD  hydrocortisone-pramoxine (PROCTOFOAM [facility]) rectal foam  Place 1 applicator rectally 3 (three) times daily. 01/23/22   Molpus, John, MD                                                                                                                                    Past Surgical History Past Surgical History:  Procedure Laterality Date   NO PAST SURGERIES     Family History Family History  Problem Relation Age of Onset   Cancer Paternal Grandmother     Social History Social History   Tobacco Use   Smoking status: Never   Smokeless tobacco: Never  Vaping Use   Vaping status: Never Used  Substance Use Topics   Alcohol use: Yes    Comment: Occ   Drug use: Yes    Types: Marijuana    Comment: Occ   Allergies Penicillins  Review of Systems Review of Systems  Constitutional:  Negative for chills and fever.  HENT:  Positive for congestion and sinus pressure.  Respiratory:  Positive for cough and shortness of breath. Negative for chest tightness and stridor.   Cardiovascular:  Negative for chest pain, palpitations and leg swelling.  Gastrointestinal:  Negative for abdominal pain, nausea and vomiting.  Genitourinary:  Negative for difficulty urinating.  Musculoskeletal:  Positive for arthralgias.  Skin:  Negative for rash.  Neurological:  Negative for headaches.  All other systems reviewed and are negative.   Physical Exam Vital Signs  I have reviewed the triage vital signs BP 119/72   Pulse (!) 101   Temp 98.1 F (36.7 C) (Oral)   Resp (!) 22   Wt 68.9 kg   SpO2 100%   BMI 25.29 kg/m  Physical Exam Vitals and nursing note reviewed.  Constitutional:      General: She is not in acute distress.    Appearance: Normal appearance. She is not ill-appearing.  HENT:     Head: Normocephalic and atraumatic.     Right Ear: External ear normal.     Left Ear: External ear normal.     Nose: Nose normal.     Mouth/Throat:     Mouth: Mucous membranes are moist.  Eyes:     General: No scleral icterus.       Right eye: No  discharge.        Left eye: No discharge.  Cardiovascular:     Rate and Rhythm: Normal rate and regular rhythm.     Pulses: Normal pulses.     Heart sounds: Normal heart sounds.  Pulmonary:     Effort: Pulmonary effort is normal. No respiratory distress.     Breath sounds: No stridor. Decreased breath sounds and wheezing present.     Comments: Trace wheeze b/l Abdominal:     General: Abdomen is flat. There is no distension.     Palpations: Abdomen is soft.     Tenderness: There is no abdominal tenderness.  Musculoskeletal:     Cervical back: No rigidity.     Right lower leg: No edema.     Left lower leg: No edema.  Skin:    General: Skin is warm and dry.     Capillary Refill: Capillary refill takes less than 2 seconds.  Neurological:     Mental Status: She is alert.  Psychiatric:        Mood and Affect: Mood normal.        Behavior: Behavior normal. Behavior is cooperative.     ED Results and Treatments Labs (all labs ordered are listed, but only abnormal results are displayed) Labs Reviewed  RESP PANEL BY RT-PCR (RSV, FLU A&B, COVID)  [id number]                                                                                                                          Radiology DG Chest 2 View  Result Date: 01/23/2023 CLINICAL DATA:  Cough.  Difficulty breathing. EXAM: CHEST - 2 VIEW COMPARISON:  None Available. FINDINGS: The heart  size and mediastinal contours are normal. The lungs are clear. There is no pleural effusion or pneumothorax. No acute osseous findings are identified. IMPRESSION: No active cardiopulmonary process. Electronically Signed   By: Carey Bullocks M.D.   On: 01/23/2023 15:55    Pertinent labs & imaging results that were available during my care of the patient were reviewed by me and considered in my medical decision making (see MDM for details).  Medications Ordered in ED Medications  albuterol (VENTOLIN HFA) 108 (90 Base) MCG/ACT inhaler 2 puff (2 puffs  Inhalation Given 01/23/23 1621)  AeroChamber Plus Flo-Vu Medium MISC 1 each (1 each Other Given by Other 01/23/23 1704)  ipratropium-albuterol (DUONEB) 0.5-2.5 (3) MG/3ML nebulizer solution 3 mL (3 mLs Nebulization Given 01/23/23 1621)                                                                                                                                     Procedures Procedures  (including critical care time)  Medical Decision Making / ED Course    Medical Decision Making:    Kazandra Infield is a 22 y.o. female with past medical history as below, significant for asthma, alcohol abuse, who presents to the ED with complaint of short of breath, out of inhaler. The complaint involves an extensive differential diagnosis and also carries with it a high risk of complications and morbidity.  Serious etiology was considered. Ddx includes but is not limited to: In my evaluation of this patient's dyspnea my DDx includes, but is not limited to, pneumonia, pulmonary embolism, pneumothorax, pulmonary edema, metabolic acidosis, asthma, COPD, cardiac cause, anemia, anxiety, etc.    Complete initial physical exam performed, notably the patient was in no acute distress, no hypoxia, no conversational dyspnea.    Reviewed and confirmed nursing documentation for past medical history, family history, social history.  Vital signs reviewed.    History of asthma, recent URI symptoms, here with dyspnea.  Get chest x-ray, RVP, give NBT  Clinical Course as of 01/23/23 1725  Sun Jan 23, 2023  1722 Symptoms greatly improved, wheezing resolved, no hypoxia  [SG]    Clinical Course User Index [SG] Sloan Leiter, DO    Brief summary: 22 year old female here with URI type symptoms, difficulty breathing.  History of asthma.  Chest x-ray unremarkable, RVP was negative.  She is feeling much better after nebulized breathing treatment.  Favor URI potentially provoking mild asthma exacerbation.  She was given albuterol  inhaler with spacer here in the ER.  Encouraged supportive care at home, follow-up PCP  The patient improved significantly and was discharged in stable condition. Detailed discussions were had with the patient regarding current findings, and need for close f/u with PCP or on call doctor. The patient has been instructed to return immediately if the symptoms worsen in any way for re-evaluation. Patient verbalized understanding and is in agreement with current care plan. All questions answered  prior to discharge.                  Additional history obtained: -Additional history obtained from na -External records from outside source obtained and reviewed including: Chart review including previous notes, labs, imaging, consultation notes including  Home meds Prior ed visits Prior labs/imaging    Lab Tests: -I ordered, reviewed, and interpreted labs.   The pertinent results include:   Labs Reviewed  RESP PANEL BY RT-PCR (RSV, FLU A&B, COVID)  [id number]    Notable for RVP neg  EKG   EKG Interpretation Date/Time:  Sunday January 23 2023 14:25:46 EST Ventricular Rate:  84 PR Interval:  174 QRS Duration:  82 QT Interval:  334 QTC Calculation: 394 R Axis:   51  Text Interpretation: Normal sinus rhythm Normal ECG No previous ECGs available Interpretation limited secondary to artifact Confirmed by Tanda Rockers ([id number]) on 01/23/2023 3:26:32 PM         Imaging Studies ordered: I ordered imaging studies including CXR I independently visualized the following imaging with scope of interpretation limited to determining acute life threatening conditions related to emergency care; findings noted above I independently visualized and interpreted imaging. I agree with the radiologist interpretation   Medicines ordered and prescription drug management: Meds ordered this encounter  Medications   albuterol (VENTOLIN HFA) 108 (90 Base) MCG/ACT inhaler 2 puff   AeroChamber Plus Flo-Vu  Medium MISC 1 each   ipratropium-albuterol (DUONEB) 0.5-2.5 (3) MG/3ML nebulizer solution 3 mL   oxymetazoline (AFRIN NASAL SPRAY) 0.05 % nasal spray    Sig: Place 1 spray into both nostrils 2 (two) times daily for 3 days.    Dispense:  15 mL    Refill:  0   guaiFENesin-dextromethorphan (ROBITUSSIN DM) 100-10 MG/5ML syrup    Sig: Take 5 mLs by mouth every 4 (four) hours as needed for cough.    Dispense:  118 mL    Refill:  0   albuterol (VENTOLIN HFA) 108 (90 Base) MCG/ACT inhaler    Sig: Inhale 1-2 puffs into the lungs every 6 (six) hours as needed for wheezing or shortness of breath.    Dispense:  1 each    Refill:  1    -I have reviewed the patients home medicines and have made adjustments as needed   Consultations Obtained: na   Cardiac Monitoring: Continuous pulse oximetry interpreted by myself, 100% on RA.    Social Determinants of Health:  Diagnosis or treatment significantly limited by social determinants of health: lives at home   Reevaluation: After the interventions noted above, I reevaluated the patient and found that they have improved  Co morbidities that complicate the patient evaluation  Past Medical History:  Diagnosis Date   Asthma       Dispostion: Disposition decision including need for hospitalization was considered, and patient discharged from emergency department.    Final Clinical Impression(s) / ED Diagnoses Final diagnoses:  Viral URI  Mild intermittent asthma with exacerbation        Sloan Leiter, DO 01/23/23 1725

## 2023-01-23 NOTE — ED Notes (Signed)
RT evaluated pt. RT kept asking the Pt to take a deep breath and there wasn't really any effort. RT explained that I wouldn't know how to treat her if I couldn't hear he breath sounds. RT asked the Pt again to take a deep breath through her nose and was able to hear that her lungs sounded clear. The Pt is out of her inhaler. RT will re evaluate the Pt when she gets a room

## 2023-01-23 NOTE — ED Triage Notes (Signed)
Pt awoke,couldn't breathe, felt like her chest was full. She still feels like it is difficult to catch her breath. Pt has asthma, pt hasn't gotten her inhaler prescription renewed, but has been ok with out it.

## 2023-02-14 IMAGING — CT CT RENAL STONE PROTOCOL
2 of 4 series · 16 of 46 positions shown, 18 images · non-contrast
Comparison: July 21, 2004

CLINICAL DATA: Bilateral flank pain.

EXAM:
CT ABDOMEN AND PELVIS WITHOUT CONTRAST
TECHNIQUE: Multidetector CT imaging of the abdomen and pelvis was performed
following the standard protocol without IV contrast.

[Series 2: axial st · axial · 0.67mm/px · z∈[-490,-115]mm · 13 of 86 slices shown, 15 images]
[im 6/86  soft-tissue]
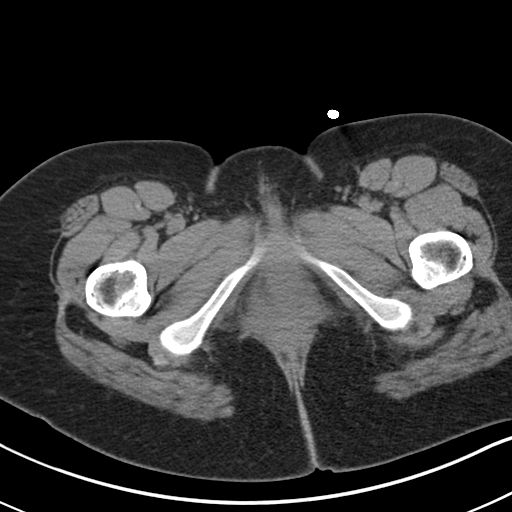
[im 6/86  bone]
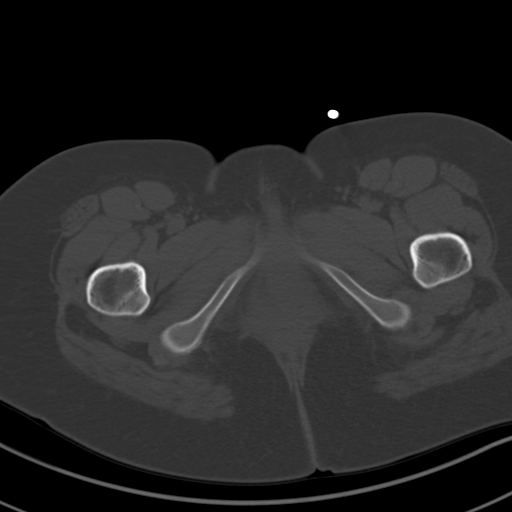
[im 11/86  soft-tissue]
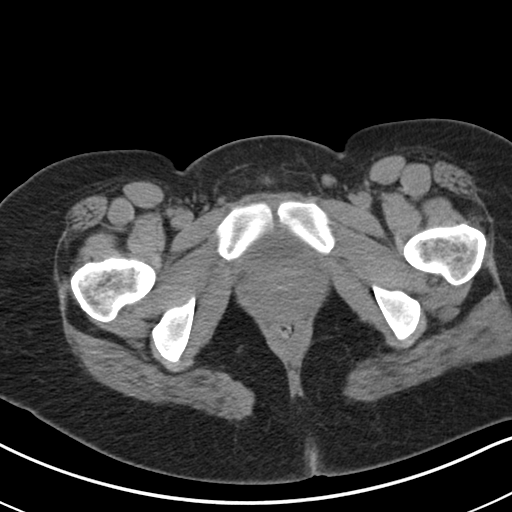
[im 21/86  soft-tissue]
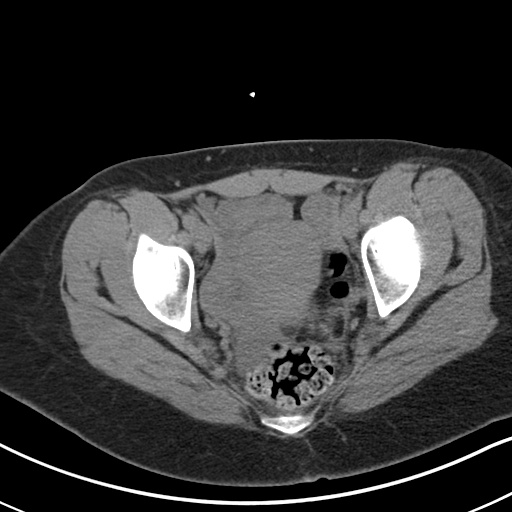
[im 26/86  soft-tissue]
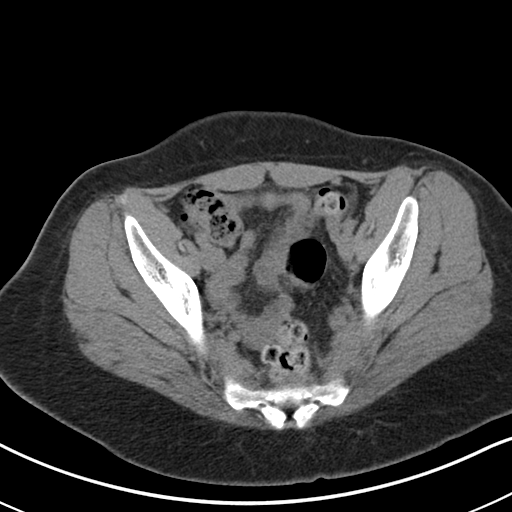
[im 31/86  soft-tissue]
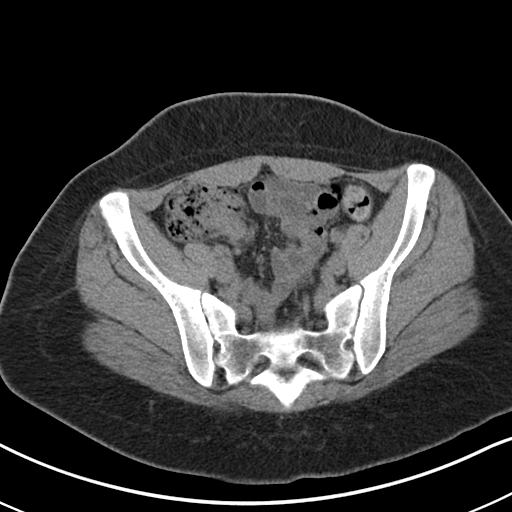
[im 36/86  soft-tissue]
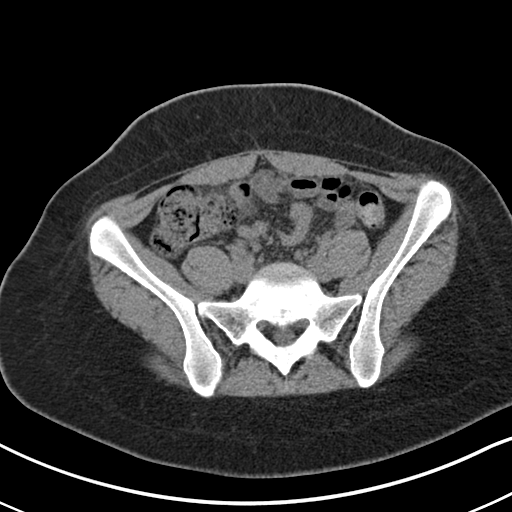
[im 46/86  soft-tissue]
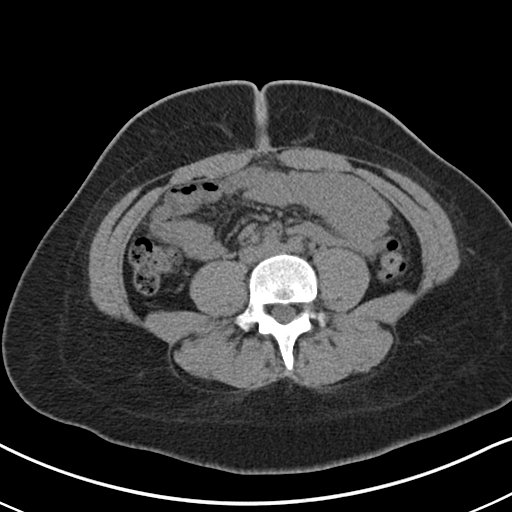
[im 51/86  soft-tissue]
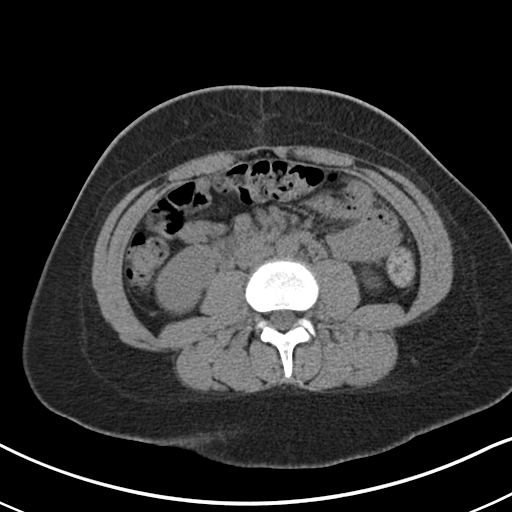
[im 56/86  soft-tissue]
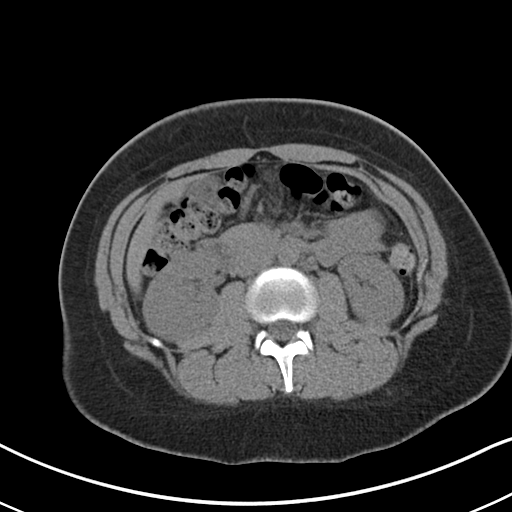
[im 56/86  bone]
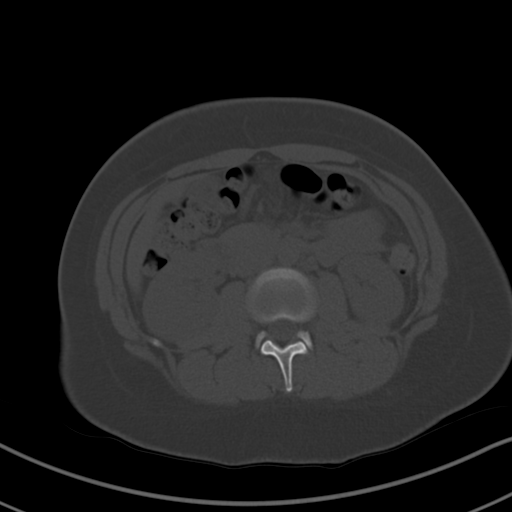
[im 61/86  soft-tissue]
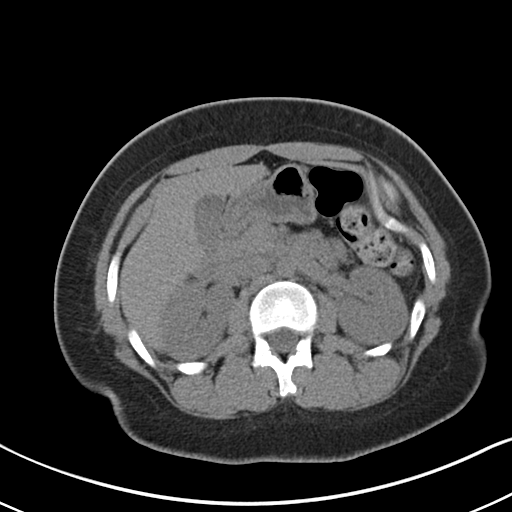
[im 66/86  soft-tissue]
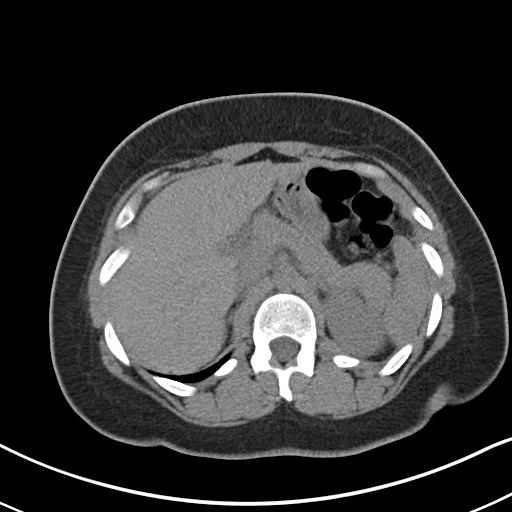
[im 76/86  soft-tissue]
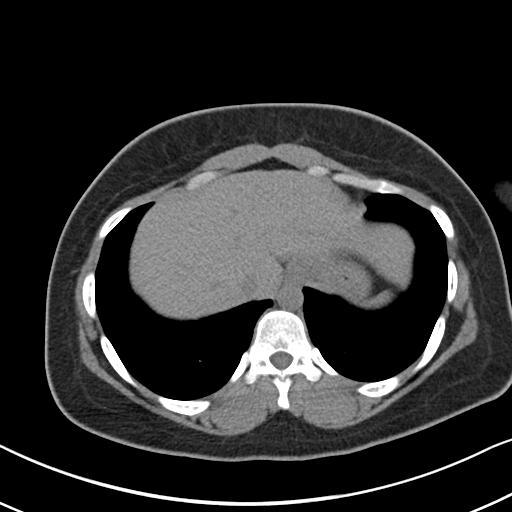
[im 81/86  soft-tissue]
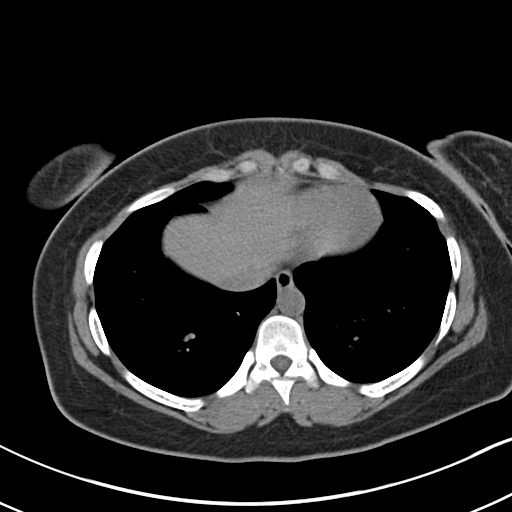

[Series 5: coronal · coronal · 0.72mm/px · 3 of 139 slices shown]
[im 47/139  soft-tissue]
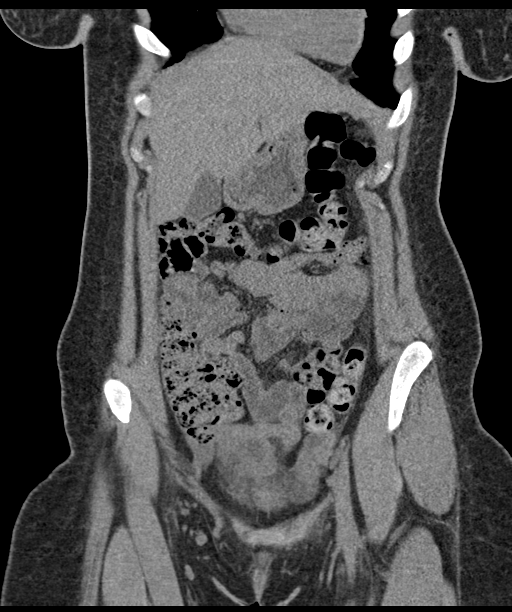
[im 62/139  soft-tissue]
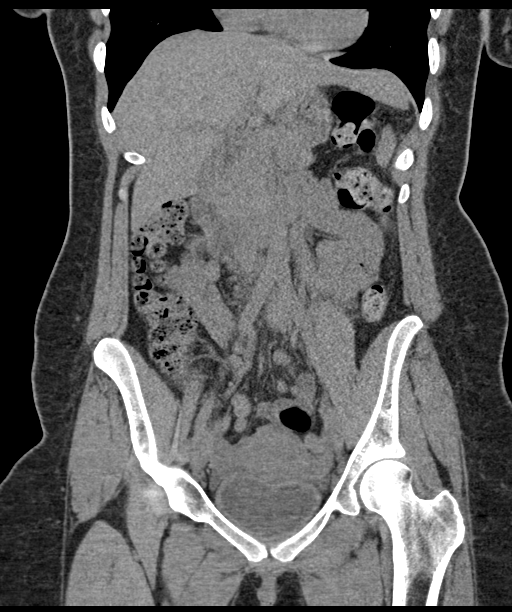
[im 77/139  soft-tissue]
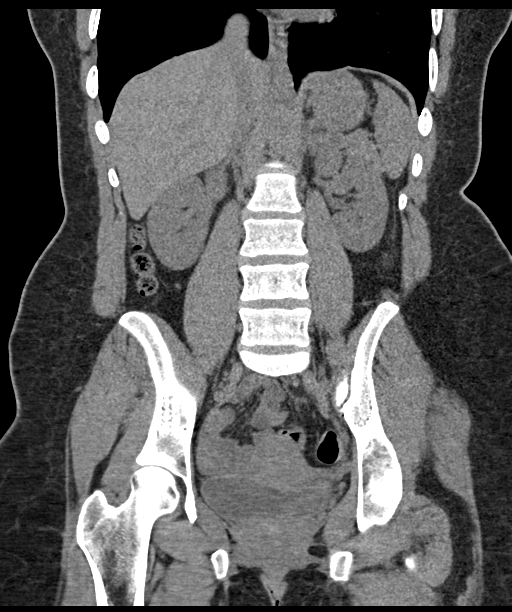

[16 of 46 positions shown; findings below may reference images not displayed]

FINDINGS: Lower chest: No acute abnormality.

Hepatobiliary: No focal liver abnormality is seen. No gallstones,
gallbladder wall thickening, or biliary dilatation.

Pancreas: Unremarkable. No pancreatic ductal dilatation or
surrounding inflammatory changes.

Spleen: Normal in size without focal abnormality.

Adrenals/Urinary Tract: Adrenal glands are unremarkable. Kidneys are
normal, without renal calculi, focal lesion, or hydronephrosis.
Bladder is unremarkable.

Stomach/Bowel: Stomach is within normal limits. Appendix appears
normal. No evidence of bowel wall thickening, distention, or
inflammatory changes.

Vascular/Lymphatic: No significant vascular findings are present. No
enlarged abdominal or pelvic lymph nodes.

Reproductive: Uterus and bilateral adnexa are unremarkable.

Other: No abdominal wall hernia or abnormality. A very small amount
of posterior pelvic free fluid is noted.

Musculoskeletal: No acute or significant osseous findings.
IMPRESSION: 1. Very small amount of posterior pelvic free fluid, likely
physiologic.
2. No acute or active process within the abdomen or pelvis.

## 2023-05-25 ENCOUNTER — Encounter: Admitting: Obstetrics and Gynecology

## 2023-07-23 ENCOUNTER — Emergency Department (HOSPITAL_BASED_OUTPATIENT_CLINIC_OR_DEPARTMENT_OTHER)
Admission: EM | Admit: 2023-07-23 | Discharge: 2023-07-23 | Disposition: A | Discharge status: Home / Self Care | Attending: Emergency Medicine | Admitting: Emergency Medicine

## 2023-07-23 ENCOUNTER — Encounter (HOSPITAL_BASED_OUTPATIENT_CLINIC_OR_DEPARTMENT_OTHER): Payer: Self-pay | Admitting: *Deleted

## 2023-07-23 ENCOUNTER — Other Ambulatory Visit: Payer: Self-pay

## 2023-07-23 DIAGNOSIS — R21 Rash and other nonspecific skin eruption: Secondary | ICD-10-CM | POA: Diagnosis present

## 2023-07-23 DIAGNOSIS — J45909 Unspecified asthma, uncomplicated: Secondary | ICD-10-CM | POA: Diagnosis not present

## 2023-07-23 MED ORDER — HYDROCORTISONE 1 % EX CREA: TOPICAL_CREAM | CUTANEOUS | 0 refills | Status: AC

## 2023-07-23 NOTE — Discharge Instructions (Addendum)
 You were seen today for rash, suspect this might be due to from your change in soap and will prescribe a low potency steroid cream to see if this helps, if you have persistent symptoms, follow-up with PCP and/or call dermatology to see if they can schedule an appointment.  Go to PCP if they are not to have them refer you to dermatology.  Return to ED for any new or worsening symptoms including fever, shortness breath, difficulty swallowing, vaginal discharge, abdominal pain, nausea or vomiting, painful lesions.

## 2023-07-23 NOTE — ED Triage Notes (Signed)
 Patient to ED reporting multiple marks showing up her skin over the past two days. Patient has hx of eczema but reports this is different. Rash is not painful.

## 2023-07-23 NOTE — ED Provider Notes (Signed)
 [facility] AT [facility] Provider Note   CSN: [id number] Arrival date & time: 07/23/23  1454     History  Chief Complaint  Patient presents with   Rash    Theresa Barrett is a 23 y.o. female.   Rash Patient is a 23 year old female resents ED today with concerns of a 3-day history of a intermittent pedunculated rash, nonpruritic, nonpainful.  States that she does have a history of eczema and hidradenitis however has not experienced any like this in the past.  Notes that she has recently changed her soap for her bath but otherwise no other changes to her skin care routine, no pool/hot tub exposure.  Reports that she is asymptomatic otherwise with no other complaints.  Denies fever, headache, vision changes, chest pain, shortness of breath, abdominal pain, nausea, vomiting, diarrhea, oral lesions, vaginal lesions, vaginal discharge, vaginal bleeding, swelling.     Home Medications Prior to Admission medications   Medication Sig Start Date End Date Taking? Authorizing Provider  hydrocortisone cream 1 % Apply to affected area 2 times daily 07/23/23  Yes [address], Saw Mendenhall S, PA-C  albuterol  (VENTOLIN  HFA) 108 (90 Base) MCG/ACT inhaler Inhale 1-2 puffs into the lungs every 6 (six) hours as needed for wheezing or shortness of breath. 01/23/23   Teddi Favors, DO  cephALEXin  (KEFLEX ) 500 MG capsule Take 1 capsule (500 mg total) by mouth 4 (four) times daily. 02/22/22   Orvilla Blander, MD  famotidine  (PEPCID ) 20 MG tablet Take 1 tablet (20 mg total) by mouth 2 (two) times daily. 08/10/22   Rosealee Concha, MD  guaiFENesin -dextromethorphan (ROBITUSSIN DM) 100-10 MG/5ML syrup Take 5 mLs by mouth every 4 (four) hours as needed for cough. 01/23/23   Teddi Favors, DO  hydrocortisone-pramoxine (PROCTOFOAM  HC) rectal foam Place 1 applicator rectally 3 (three) times daily. 01/23/22   Molpus, John, MD      Allergies    Penicillins    Review of Systems   Review of Systems  Skin:   Positive for rash.  All other systems reviewed and are negative.   Physical Exam Updated Vital Signs BP 122/81 (BP Location: Left Arm)   Pulse 67   Temp 98.5 F (36.9 C)   Resp 14   SpO2 100%  Physical Exam Vitals and nursing note reviewed.  Constitutional:      General: She is not in acute distress.    Appearance: Normal appearance. She is not ill-appearing or diaphoretic.  HENT:     Head: Normocephalic and atraumatic.  Eyes:     General: No scleral icterus.       Right eye: No discharge.        Left eye: No discharge.     Extraocular Movements: Extraocular movements intact.     Conjunctiva/sclera: Conjunctivae normal.  Cardiovascular:     Rate and Rhythm: Normal rate and regular rhythm.     Pulses: Normal pulses.     Heart sounds: Normal heart sounds. No murmur heard.    No friction rub. No gallop.  Pulmonary:     Effort: Pulmonary effort is normal. No respiratory distress.     Breath sounds: Normal breath sounds. No stridor. No wheezing, rhonchi or rales.  Chest:     Chest wall: No tenderness.  Abdominal:     General: Abdomen is flat. There is no distension.     Palpations: Abdomen is soft.     Tenderness: There is no abdominal tenderness. There is no guarding.  Musculoskeletal:  General: No swelling or tenderness.  Skin:    General: Skin is warm and dry.     Coloration: Skin is not jaundiced or pale.     Findings: Rash present. No bruising, erythema or lesion.  Neurological:     General: No focal deficit present.     Mental Status: She is alert and oriented to person, place, and time. Mental status is at baseline.     Sensory: No sensory deficit.     Motor: No weakness.     Gait: Gait normal.  Psychiatric:        Mood and Affect: Mood normal.     ED Results / Procedures / Treatments   Labs (all labs ordered are listed, but only abnormal results are displayed) Labs Reviewed - No data to display  EKG None  Radiology No results  found.  Procedures Procedures    Medications Ordered in ED Medications - No data to display  ED Course/ Medical Decision Making/ A&P                                 Medical Decision Making  This patient is a 23 year old female who presents to the ED for concern of painless, nonpruritic pedunculated rash noted across chest, legs, arms x 3 days with some lesions notably disappearing and new ones.  On physical exam, patient is in no acute distress, afebrile, alert and orient x 4, speaking in full sentences, nontachypneic, nontachycardic.  Picture of rash noted in chart.  Unremarkable exam otherwise.  Low suspicion for any infectious cause, suspect possibly some dermatitis from new change in soap and will prescribe low potency steroid cream to see if this helps, will have her follow-up with dermatology and PCP.  Return to the ED for any new or worsening symptoms.  Patient vital signs have remained stable throughout the course of patient's time in the ED. Low suspicion for any other emergent pathology at this time. I believe this patient is safe to be discharged. Provided strict return to ER precautions. Patient expressed agreement and understanding of plan. All questions were answered.  Differential diagnoses prior to evaluation: The emergent differential diagnosis includes, but is not limited to,  Acrochordons, acute urticaria, contact dermatitis, viral exanthem, molluscum contagiosum, pityriasis rosea, meningococcemia, anaphylaxis, SJS. This is not an exhaustive differential.   Past Medical History / Co-morbidities / Social History: Asthma, eczema, hidradenitis  Additional history: Chart reviewed.   Lab Tests/Imaging studies: No labs or imaging are needed at this time, as patient was otherwise asymptomatic and lesions nonpruritic, nonpainful   Medications: I ordered medication including hydrocortisone cream.  I have reviewed the patients home medicines and have made adjustments as  needed.   Social Determinants of Health: Medicaid  Disposition: After consideration of the diagnostic results and the patients response to treatment, I feel that the patient would benefit from discharge and treatment as above.   emergency department workup does not suggest an emergent condition requiring admission or immediate intervention beyond what has been performed at this time. The plan is: Follow-up with PCP, call dermatology office to see if they are available, low potency steroid cream, return to ED for new or worsening symptoms. The patient is safe for discharge and has been instructed to return immediately for worsening symptoms, change in symptoms or any other concerns.    Final Clinical Impression(s) / ED Diagnoses Final diagnoses:  Rash    Rx /  DC Orders ED Discharge Orders          Ordered    hydrocortisone cream 1 %        07/23/23 1700              Hayes Lipps, [place] 07/23/23 1703    Hershel Los, MD 07/23/23 1704

## 2024-03-07 ENCOUNTER — Ambulatory Visit: Payer: Self-pay

## 2024-03-20 ENCOUNTER — Other Ambulatory Visit: Payer: Self-pay

## 2024-03-20 ENCOUNTER — Emergency Department (HOSPITAL_BASED_OUTPATIENT_CLINIC_OR_DEPARTMENT_OTHER)

## 2024-03-20 ENCOUNTER — Encounter (HOSPITAL_BASED_OUTPATIENT_CLINIC_OR_DEPARTMENT_OTHER): Payer: Self-pay | Admitting: Emergency Medicine

## 2024-03-20 ENCOUNTER — Emergency Department (HOSPITAL_BASED_OUTPATIENT_CLINIC_OR_DEPARTMENT_OTHER)
Admission: EM | Admit: 2024-03-20 | Discharge: 2024-03-20 | Disposition: A | Discharge status: Home / Self Care | Attending: Emergency Medicine | Admitting: Emergency Medicine

## 2024-03-20 DIAGNOSIS — Z3A01 Less than 8 weeks gestation of pregnancy: Secondary | ICD-10-CM | POA: Insufficient documentation

## 2024-03-20 DIAGNOSIS — O209 Hemorrhage in early pregnancy, unspecified: Secondary | ICD-10-CM | POA: Insufficient documentation

## 2024-03-20 LAB — CBC WITH DIFFERENTIAL/PLATELET
Abs Immature Granulocytes: 0 10*3/uL (ref 0.00–0.07)
Basophils Absolute: 0 10*3/uL (ref 0.0–0.1)
Basophils Relative: 1 %
Eosinophils Absolute: 0.1 10*3/uL (ref 0.0–0.5)
Eosinophils Relative: 2 %
HCT: 38.8 % (ref 36.0–46.0)
Hemoglobin: 12.9 g/dL (ref 12.0–15.0)
Immature Granulocytes: 0 %
Lymphocytes Relative: 52 %
Lymphs Abs: 2.5 10*3/uL (ref 0.7–4.0)
MCH: 26.4 pg (ref 26.0–34.0)
MCHC: 33.2 g/dL (ref 30.0–36.0)
MCV: 79.3 fL — ABNORMAL LOW (ref 80.0–100.0)
Monocytes Absolute: 0.4 10*3/uL (ref 0.1–1.0)
Monocytes Relative: 8 %
Neutro Abs: 1.8 10*3/uL (ref 1.7–7.7)
Neutrophils Relative %: 37 %
Platelets: 237 10*3/uL (ref 150–400)
RBC: 4.89 MIL/uL (ref 3.87–5.11)
RDW: 12.9 % (ref 11.5–15.5)
WBC: 4.8 10*3/uL (ref 4.0–10.5)
nRBC: 0 % (ref 0.0–0.2)

## 2024-03-20 LAB — URINALYSIS, ROUTINE W REFLEX MICROSCOPIC
Bacteria, UA: NONE SEEN
Bilirubin Urine: NEGATIVE
Glucose, UA: NEGATIVE mg/dL
Ketones, ur: NEGATIVE mg/dL
Nitrite: NEGATIVE
RBC / HPF: >50 RBC/hpf (ref 0–5)
Specific Gravity, Urine: 1.019 (ref 1.005–1.030)
WBC, UA: >50 WBC/hpf (ref 0–5)
pH: 6.5 (ref 5.0–8.0)

## 2024-03-20 LAB — PREGNANCY, URINE: Preg Test, Ur: POSITIVE — AB

## 2024-03-20 LAB — HCG, QUANTITATIVE, PREGNANCY: hCG, Beta Chain, Quant, S: 34671 m[IU]/mL — ABNORMAL HIGH

## 2024-03-20 NOTE — Discharge Instructions (Addendum)
 Your US  revealed the following: IMPRESSION:  1. Single live intrauterine pregnancy with crown-rump length dating 7 weeks 1  day and embryonic heart rate of 150 beats per minute.   Recommend you follow-up with your OB/GYN for further prenatal care.

## 2024-03-20 NOTE — ED Triage Notes (Signed)
 Pt c/o worsening vaginal bleeding today. 1/9 pos preg test at home. Denies abd pain

## 2024-03-23 ENCOUNTER — Ambulatory Visit (HOSPITAL_BASED_OUTPATIENT_CLINIC_OR_DEPARTMENT_OTHER): Payer: Self-pay
